# Patient Record
Sex: Male | Born: 1990 | Race: White | Hispanic: No | Marital: Single | Smoking: Current some day smoker
Health system: Southern US, Community
[De-identification: ages and names within clinical notes are randomized; demographics above are authoritative.]

---
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
## 2013-03-09 ENCOUNTER — Encounter (HOSPITAL_COMMUNITY): Payer: Self-pay | Admitting: Emergency Medicine

## 2013-03-09 ENCOUNTER — Emergency Department (HOSPITAL_COMMUNITY)
Admission: EM | Admit: 2013-03-09 | Discharge: 2013-03-09 | Discharge status: Against Medical Advice | Payer: Self-pay | Attending: Emergency Medicine | Admitting: Emergency Medicine

## 2013-03-09 DIAGNOSIS — M25559 Pain in unspecified hip: Secondary | ICD-10-CM | POA: Insufficient documentation

## 2013-03-09 DIAGNOSIS — F17200 Nicotine dependence, unspecified, uncomplicated: Secondary | ICD-10-CM | POA: Insufficient documentation

## 2013-03-09 NOTE — ED Notes (Signed)
No answer

## 2013-03-09 NOTE — ED Notes (Signed)
Rt hip pain x 1 week after doing yard work. Ambulated to triage.

## 2013-03-09 NOTE — ED Notes (Signed)
Called to room x1. Not in waiting room.

## 2015-08-22 ENCOUNTER — Emergency Department (HOSPITAL_COMMUNITY)
Admission: EM | Admit: 2015-08-22 | Discharge: 2015-08-22 | Disposition: A | Discharge status: Home / Self Care | Payer: Self-pay | Attending: Emergency Medicine | Admitting: Emergency Medicine

## 2015-08-22 ENCOUNTER — Encounter (HOSPITAL_COMMUNITY): Payer: Self-pay | Admitting: Emergency Medicine

## 2015-08-22 DIAGNOSIS — Y929 Unspecified place or not applicable: Secondary | ICD-10-CM | POA: Insufficient documentation

## 2015-08-22 DIAGNOSIS — S8011XA Contusion of right lower leg, initial encounter: Secondary | ICD-10-CM | POA: Insufficient documentation

## 2015-08-22 DIAGNOSIS — F17200 Nicotine dependence, unspecified, uncomplicated: Secondary | ICD-10-CM | POA: Insufficient documentation

## 2015-08-22 DIAGNOSIS — Y939 Activity, unspecified: Secondary | ICD-10-CM | POA: Insufficient documentation

## 2015-08-22 DIAGNOSIS — X58XXXA Exposure to other specified factors, initial encounter: Secondary | ICD-10-CM | POA: Insufficient documentation

## 2015-08-22 DIAGNOSIS — Y999 Unspecified external cause status: Secondary | ICD-10-CM | POA: Insufficient documentation

## 2015-08-22 LAB — CBC WITH DIFFERENTIAL/PLATELET
BASOS PCT: 1 %
Basophils Absolute: 0 10*3/uL (ref 0.0–0.1)
EOS PCT: 4 %
Eosinophils Absolute: 0.4 10*3/uL (ref 0.0–0.7)
HEMATOCRIT: 43.9 % (ref 39.0–52.0)
Hemoglobin: 14.9 g/dL (ref 13.0–17.0)
LYMPHS PCT: 31 %
Lymphs Abs: 2.5 10*3/uL (ref 0.7–4.0)
MCH: 30.4 pg (ref 26.0–34.0)
MCHC: 33.9 g/dL (ref 30.0–36.0)
MCV: 89.6 fL (ref 78.0–100.0)
MONO ABS: 0.5 10*3/uL (ref 0.1–1.0)
MONOS PCT: 6 %
NEUTROS ABS: 4.7 10*3/uL (ref 1.7–7.7)
Neutrophils Relative %: 58 %
PLATELETS: 219 10*3/uL (ref 150–400)
RBC: 4.9 MIL/uL (ref 4.22–5.81)
RDW: 12.6 % (ref 11.5–15.5)
WBC: 8.1 10*3/uL (ref 4.0–10.5)

## 2015-08-22 LAB — BASIC METABOLIC PANEL
Anion gap: 5 (ref 5–15)
BUN: 10 mg/dL (ref 6–20)
CALCIUM: 9.2 mg/dL (ref 8.9–10.3)
CO2: 27 mmol/L (ref 22–32)
CREATININE: 1.18 mg/dL (ref 0.61–1.24)
Chloride: 107 mmol/L (ref 101–111)
GFR calc Af Amer: >60 mL/min (ref >60)
GLUCOSE: 92 mg/dL (ref 65–99)
Potassium: 4.1 mmol/L (ref 3.5–5.1)
Sodium: 139 mmol/L (ref 135–145)

## 2015-08-22 MED ORDER — KETOROLAC TROMETHAMINE 10 MG PO TABS
10.0000 mg | ORAL_TABLET | Freq: Once | ORAL | Status: AC
  Administered 2015-08-22: 10 mg via ORAL
  Filled 2015-08-22: qty 1

## 2015-08-22 MED ORDER — PREDNISONE 20 MG PO TABS
40.0000 mg | ORAL_TABLET | Freq: Once | ORAL | Status: AC
  Administered 2015-08-22: 40 mg via ORAL
  Filled 2015-08-22: qty 2

## 2015-08-22 MED ORDER — DEXAMETHASONE 4 MG PO TABS: 4.0000 mg | ORAL_TABLET | Freq: Two times a day (BID) | ORAL | Status: AC

## 2015-08-22 MED ORDER — DICLOFENAC SODIUM 75 MG PO TBEC: 75.0000 mg | DELAYED_RELEASE_TABLET | Freq: Two times a day (BID) | ORAL | Status: AC

## 2015-08-22 NOTE — ED Provider Notes (Signed)
CSN: [id number]     Arrival date & time 08/22/15  2046 History   First MD Initiated Contact with Patient 08/22/15 2103     Chief Complaint  Patient presents with  . Leg Pain     (Consider location/radiation/quality/duration/timing/severity/associated sxs/prior Treatment) HPI Comments: Patient is a 25 year old male who presents to the emergency department with a complaint of pain and swelling of the right lower extremity.  The patient states that about a week ago he stepped through a porch and injured his right leg. He states he had a few scratches, but they healed up. He had mild soreness present but that mostly resolved. 2 days ago he began to notice some increasing swelling of his right leg near the right knee area. Today he notices even more swelling. He has some soreness particularly with certain movement. He has not had high fever. He has not noted red streaking in this area. He's not had a new injury to the area. He has no difficulty with movement of the right hip, ankle, or toes.  Patient is a 25 y.o. male presenting with leg pain. The history is provided by the patient.  Leg Pain Associated symptoms: no back pain and no neck pain     History reviewed. No pertinent past medical history. History reviewed. No pertinent past surgical history. No family history on file. Social History  Substance Use Topics  . Smoking status: Current Some Day Smoker  . Smokeless tobacco: None  . Alcohol Use: No    Review of Systems  Constitutional: Negative for activity change.       All ROS Neg except as noted in HPI  HENT: Negative for nosebleeds.   Eyes: Negative for photophobia and discharge.  Respiratory: Negative for cough, shortness of breath and wheezing.   Cardiovascular: Negative for chest pain and palpitations.  Gastrointestinal: Negative for abdominal pain and blood in stool.  Genitourinary: Negative for dysuria, frequency and hematuria.  Musculoskeletal: Negative for back pain,  arthralgias and neck pain.  Skin: Negative.   Neurological: Negative for dizziness, seizures and speech difficulty.  Psychiatric/Behavioral: Negative for hallucinations and confusion.      Allergies  Review of patient's allergies indicates no known allergies.  Home Medications   Prior to Admission medications   Not on File   BP 128/57 mmHg  Pulse 62  Temp(Src) 98.4 F (36.9 C) (Oral)  Resp 22  Ht  (1.854 m)  Wt 99.791 kg  BMI 29.03 kg/m2  SpO2 100% Physical Exam  Constitutional: He is oriented to person, place, and time. He appears well-developed and well-nourished.  Non-toxic appearance.  HENT:  Head: Normocephalic.  Right Ear: Tympanic membrane and external ear normal.  Left Ear: Tympanic membrane and external ear normal.  Eyes: EOM and lids are normal. Pupils are equal, round, and reactive to light.  Neck: Normal range of motion. Neck supple. Carotid bruit is not present.  Cardiovascular: Normal rate, regular rhythm, normal heart sounds, intact distal pulses and normal pulses.   Pulmonary/Chest: Breath sounds normal. No respiratory distress.  Abdominal: Soft. Bowel sounds are normal. There is no tenderness. There is no guarding.  Musculoskeletal: Normal range of motion.  There is full range of motion of the right hip. There is no deformity of the quadricep area. There is a raised area consistent with a hematoma at the lateral on knee area on the right on. There no red streaks appreciated. There is some increased warmth at this area. There is no swelling or  deformity involving the tibial area. There is a small bruise at the medial foot area. The dorsalis pedis pulses 2+. Capillary refill is less than 2 seconds. The Achilles tendon is intact.  Lymphadenopathy:       Head (right side): No submandibular adenopathy present.       Head (left side): No submandibular adenopathy present.    He has no cervical adenopathy.  Neurological: He is alert and oriented to person,  place, and time. He has normal strength. No cranial nerve deficit or sensory deficit.  Skin: Skin is warm and dry.  Psychiatric: He has a normal mood and affect. His speech is normal.  Nursing note and vitals reviewed.   ED Course  Procedures (including critical care time) Labs Review Labs Reviewed  CBC WITH DIFFERENTIAL/PLATELET  BASIC METABOLIC PANEL    Imaging Review No results found. I have personally reviewed and evaluated these images and lab results as part of my medical decision-making.   EKG Interpretation None      MDM  Vital signs within normal limits. The patient has a raised area of the lateral knee area of the right lower extremity. The complete blood count and basic metabolic panel has been obtained.   Complete blood count and metabolic panel are within normal limits. Patient fitted with a knee immobilizer. The patient will use diclofenac and Decadron over the next 5-7 days. He will see Dr. Romeo AppleHarrison for orthopedic evaluation if not improving after this time. Patient is in agreement with this discharge plan.    Final diagnoses:  Hematoma of lower extremity, right, initial encounter    *I have reviewed nursing notes, vital signs, and all appropriate lab and imaging results for this patient.[address]**    Threasa Kinch, PA-C 08/22/15 2324  Zadie Rhineonald Wickline, MD 08/22/15 [phone]

## 2015-08-22 NOTE — Discharge Instructions (Signed)
Your vital signs are within normal limits. Your blood work is also within normal limits. Your examination is consistent with a hematoma involving the lateral portion of your knee. Please use the knee immobilizer over the next 5 days. If swelling and tenderness are not resolving within the next 7 days, please see Dr. Romeo AppleHarrison for orthopedic evaluation. Use diclofenac and Decadron 2 times daily with food. You do not need to sleep in the knee immobilizer device, but should have it on when you're up and about.

## 2015-08-22 NOTE — ED Notes (Addendum)
Pt with R leg pain after injury. Pt stepped off a porch wrong about a week ago and now has significant sized knot to R Leg.

## 2015-09-01 ENCOUNTER — Emergency Department (HOSPITAL_COMMUNITY): Payer: Self-pay

## 2015-09-01 ENCOUNTER — Emergency Department (HOSPITAL_COMMUNITY)
Admission: EM | Admit: 2015-09-01 | Discharge: 2015-09-02 | Disposition: A | Discharge status: Home / Self Care | Payer: Self-pay | Attending: Emergency Medicine | Admitting: Emergency Medicine

## 2015-09-01 ENCOUNTER — Encounter (HOSPITAL_COMMUNITY): Payer: Self-pay | Admitting: Emergency Medicine

## 2015-09-01 DIAGNOSIS — T792XXA Traumatic secondary and recurrent hemorrhage and seroma, initial encounter: Secondary | ICD-10-CM

## 2015-09-01 DIAGNOSIS — S8011XA Contusion of right lower leg, initial encounter: Secondary | ICD-10-CM | POA: Insufficient documentation

## 2015-09-01 DIAGNOSIS — Y929 Unspecified place or not applicable: Secondary | ICD-10-CM | POA: Insufficient documentation

## 2015-09-01 DIAGNOSIS — F17200 Nicotine dependence, unspecified, uncomplicated: Secondary | ICD-10-CM | POA: Insufficient documentation

## 2015-09-01 DIAGNOSIS — W133XXA Fall through floor, initial encounter: Secondary | ICD-10-CM | POA: Insufficient documentation

## 2015-09-01 DIAGNOSIS — Y9389 Activity, other specified: Secondary | ICD-10-CM | POA: Insufficient documentation

## 2015-09-01 DIAGNOSIS — Y999 Unspecified external cause status: Secondary | ICD-10-CM | POA: Insufficient documentation

## 2015-09-01 NOTE — ED Notes (Signed)
Pt was here last week, got volarten and a steroid last week . Now out of meds . Pt has no md or insurance and has not followed up with anyone as directed. C/O pain 7 of 10 and states the knot on his leg has not gone down

## 2015-09-01 NOTE — ED Notes (Signed)
Pt taken to xray, amb with steady gait

## 2015-09-01 NOTE — ED Notes (Signed)
Pt c/o continued rt leg pain with swelling. Pt was seen for the same last week.

## 2015-09-02 MED ORDER — TRAMADOL HCL 50 MG PO TABS: 50.0000 mg | ORAL_TABLET | Freq: Four times a day (QID) | ORAL | Status: AC | PRN

## 2015-09-02 NOTE — ED Provider Notes (Signed)
CSN: [id number]     Arrival date & time 09/01/15  2159 History   First MD Initiated Contact with Patient 09/01/15 2213     Chief Complaint  Patient presents with  . Leg Pain     (Consider location/radiation/quality/duration/timing/severity/associated sxs/prior Treatment) HPI   Lee Shields is a 25 y.o. male who presents to the Emergency Department complaining of persistent "knot" to his right lower leg.  He states that he was seen here on 08/22/15 after his lower leg fell through a wooden floor.  He reports bruising to the area and a "knot" that developed few days later.  He was seen here and treated with steroid and NSAID without relief.  He was given a knee immobilizer which has not provided relief.  He denies numbness, increased swelling or open wounds to the leg.    History reviewed. No pertinent past medical history. History reviewed. No pertinent past surgical history. History reviewed. No pertinent family history. Social History  Substance Use Topics  . Smoking status: Current Some Day Smoker  . Smokeless tobacco: None  . Alcohol Use: No    Review of Systems  Constitutional: Negative for fever and chills.  Musculoskeletal: Positive for myalgias (swollen mass to right lower leg).  Skin: Negative for color change and wound.  All other systems reviewed and are negative.     Allergies  Review of patient's allergies indicates no known allergies.  Home Medications   Prior to Admission medications   Medication Sig Start Date End Date Taking? Authorizing Provider  dexamethasone (DECADRON) 4 MG tablet Take 1 tablet (4 mg total) by mouth 2 (two) times daily with a meal. 08/22/15   Ivery Quale, PA-C  diclofenac (VOLTAREN) 75 MG EC tablet Take 1 tablet (75 mg total) by mouth 2 (two) times daily. 08/22/15   Ivery Quale, PA-C  traMADol (ULTRAM) 50 MG tablet Take 1 tablet (50 mg total) by mouth every 6 (six) hours as needed. 09/02/15   Alonza Knisley, PA-C   BP 138/82 mmHg   Pulse 68  Temp(Src) 98.4 F (36.9 C) (Oral)  Resp 18  Ht  (1.854 m)  Wt 99.791 kg  BMI 29.03 kg/m2  SpO2 99% Physical Exam  Constitutional: He is oriented to person, place, and time. He appears well-developed and well-nourished. No distress.  HENT:  Head: Normocephalic and atraumatic.  Cardiovascular: Normal rate and regular rhythm.   Pulmonary/Chest: Effort normal and breath sounds normal. No respiratory distress.  Musculoskeletal:       Legs: 4x6 cm mobile, flesh colored mass to the medial right lower leg.  No erythema, induration, or bruising.  knee joint NT, no posterior tenderness  Neurological: He is alert and oriented to person, place, and time. He exhibits normal muscle tone. Coordination normal.  Skin: Skin is warm and dry. No erythema.  Nursing note and vitals reviewed.   ED Course  Procedures (including critical care time) Labs Review Labs Reviewed - No data to display  Imaging Review Dg Tibia/fibula Right  09/02/2015  CLINICAL DATA:  Injury 2 weeks ago with laceration. Tender swelling. Subsequent encounter. EXAM: RIGHT TIBIA AND FIBULA - 2 VIEW COMPARISON:  None. FINDINGS: Swelling with subcutaneous opacity in the medial upper calf. No opaque foreign body or emphysema. No osseous abnormality. IMPRESSION: Medial upper calf swelling without opaque foreign body or soft tissue emphysema. Electronically Signed   By: Marnee Spring [id number] On: 09/02/2015 00:18   I have personally reviewed and evaluated these images and lab  results as part of my medical decision-making.   EKG Interpretation None      MDM   Final diagnoses:  Seroma due to trauma    Localized, 4x 6 cm flesh colored, mobile mass to the medial aspect of right lower leg secondary to trauma.  doubt abscess, likely seroma vs hematoma. Compartments soft, NV intact   I spoke with Dr. Grace IsaacWatts regarding further imaging with ultrasound and he did not feel that it would be beneficial to provide further  detail.    I have applied ACE wrap.  Advised not to wear continously.  Pt agrees to care plan.  Appears stable for d/c   Pauline Ausammy Starasia Sinko, PA-C 09/02/15 0118  Samuel JesterKathleen McManus, DO 09/03/15 2159

## 2017-09-15 IMAGING — DX DG TIBIA/FIBULA 2V*R*
4 series · 4 of 4 positions shown · non-contrast
Comparison: None.

CLINICAL DATA: Injury 2 weeks ago with laceration. Tender swelling.
Subsequent encounter.

EXAM:
RIGHT TIBIA AND FIBULA - 2 VIEW

[tibia ap (1 of 2)]
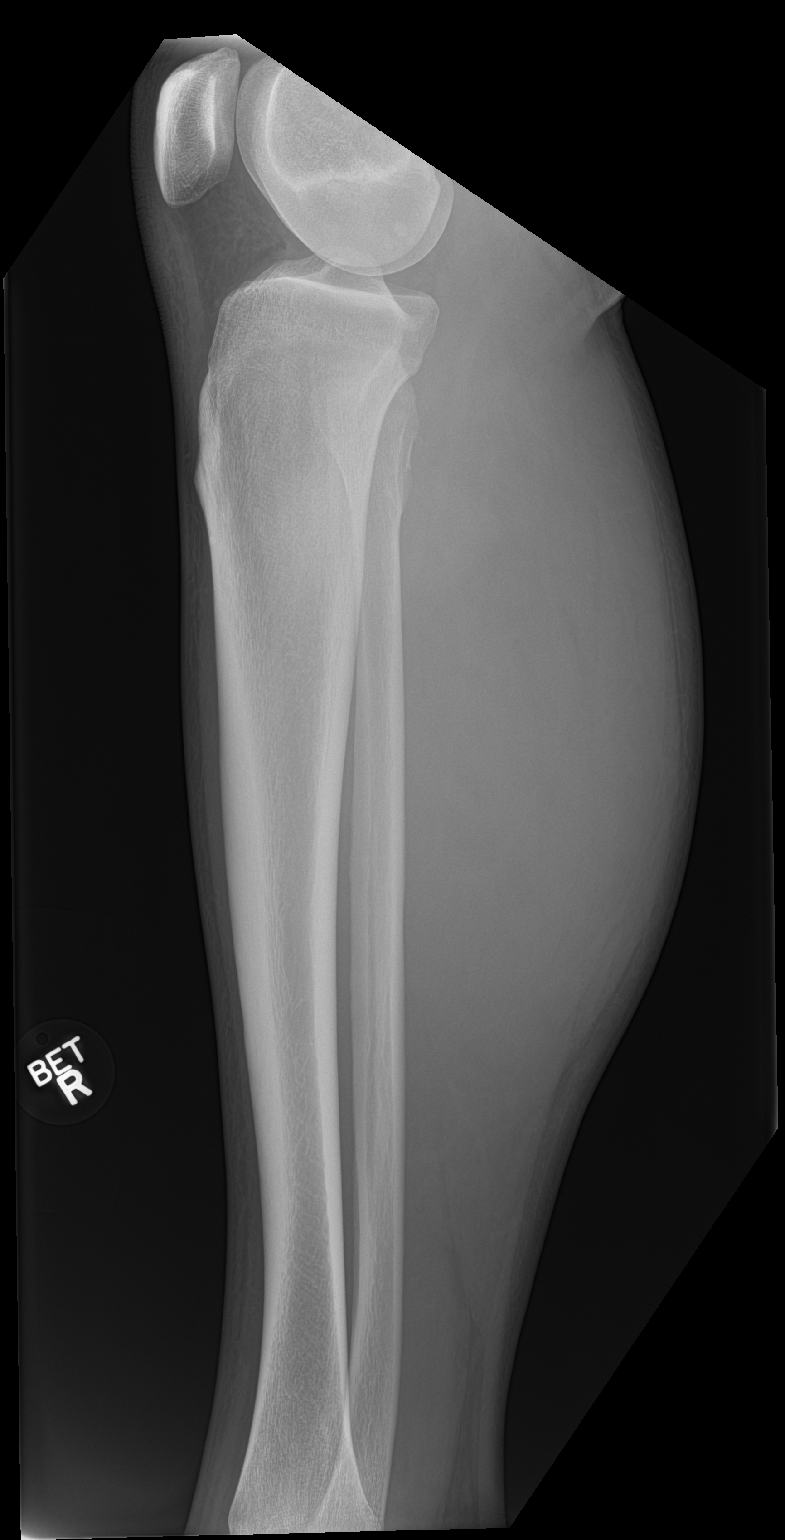

[tibia ap (2 of 2)]
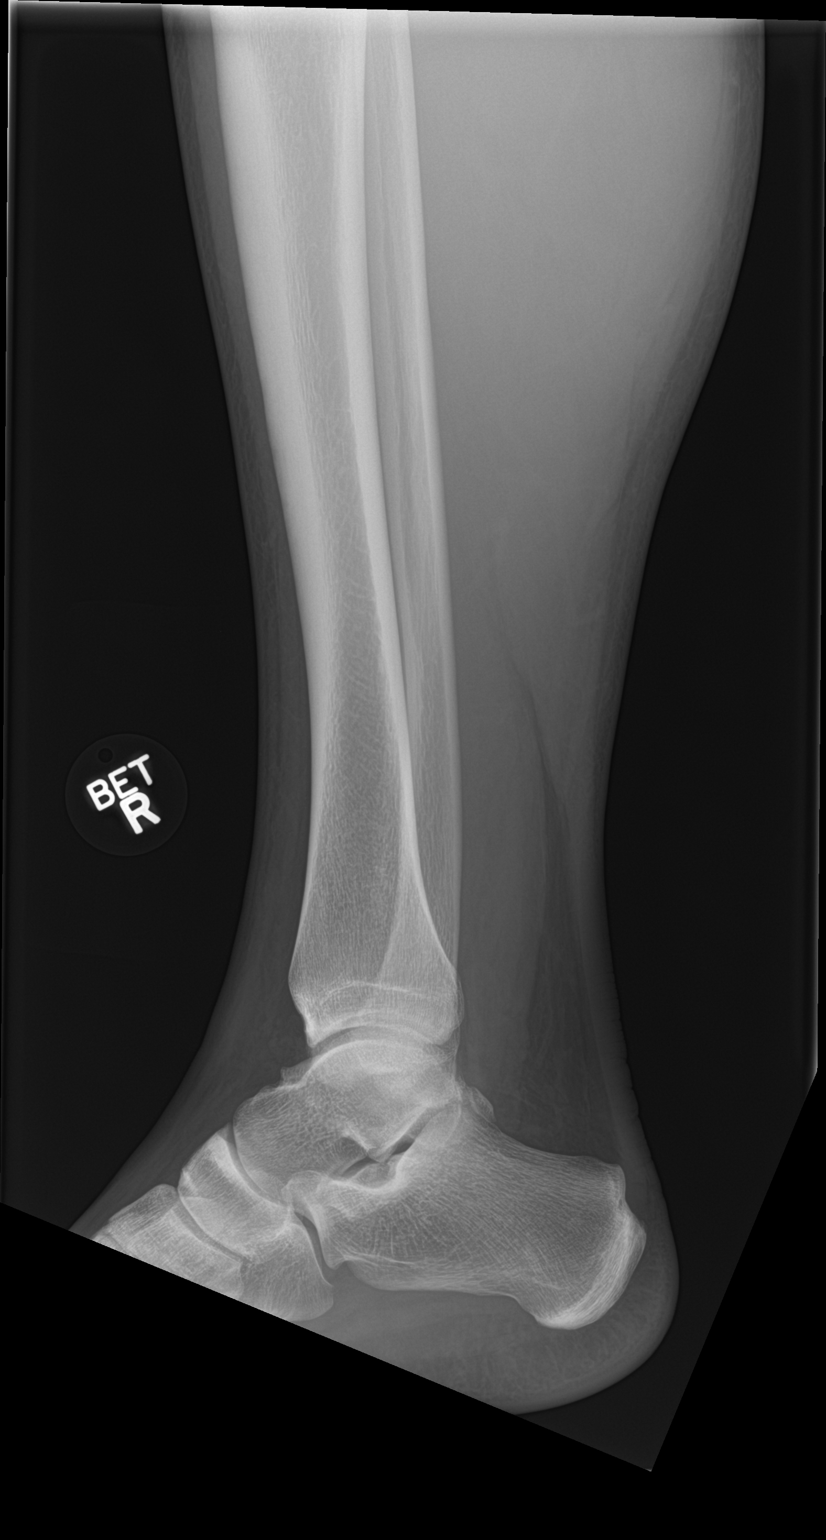

[tibia lat (1 of 2)]
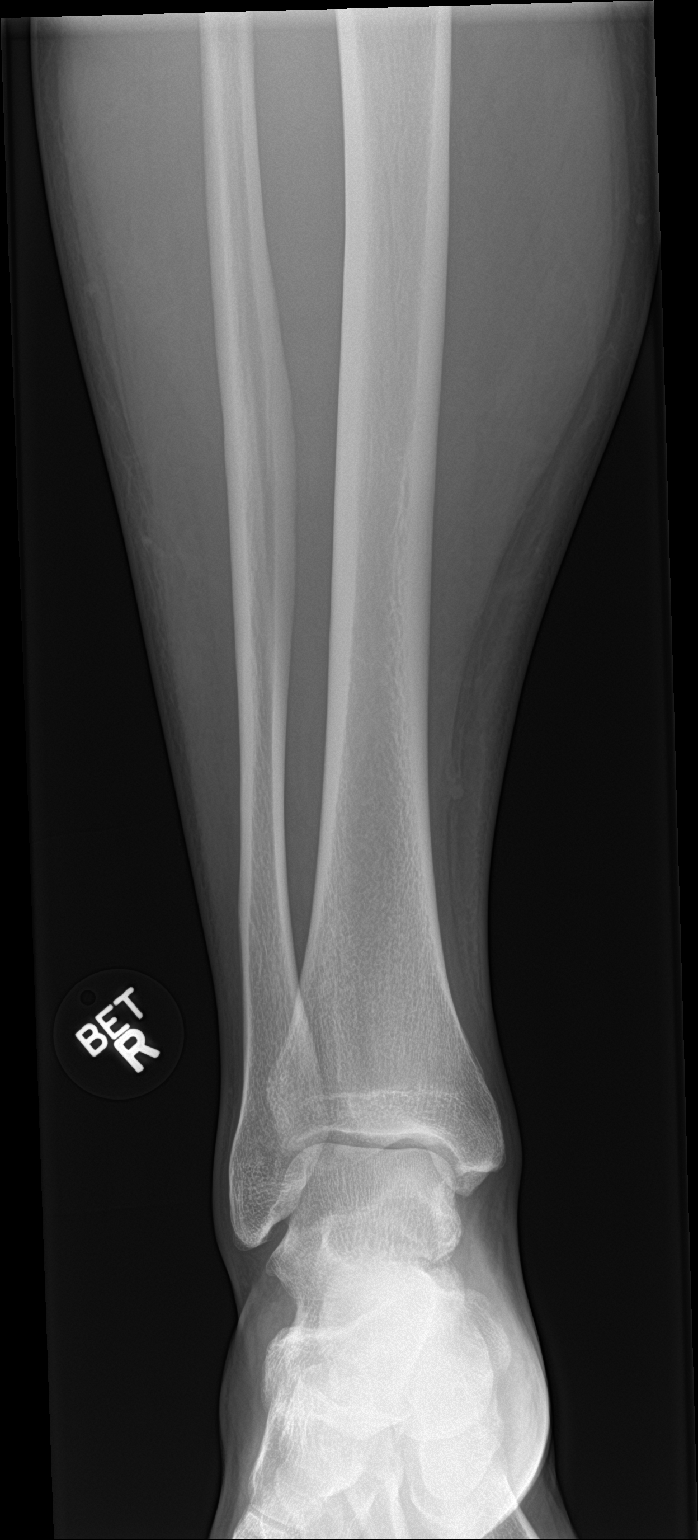

[tibia lat (2 of 2)]
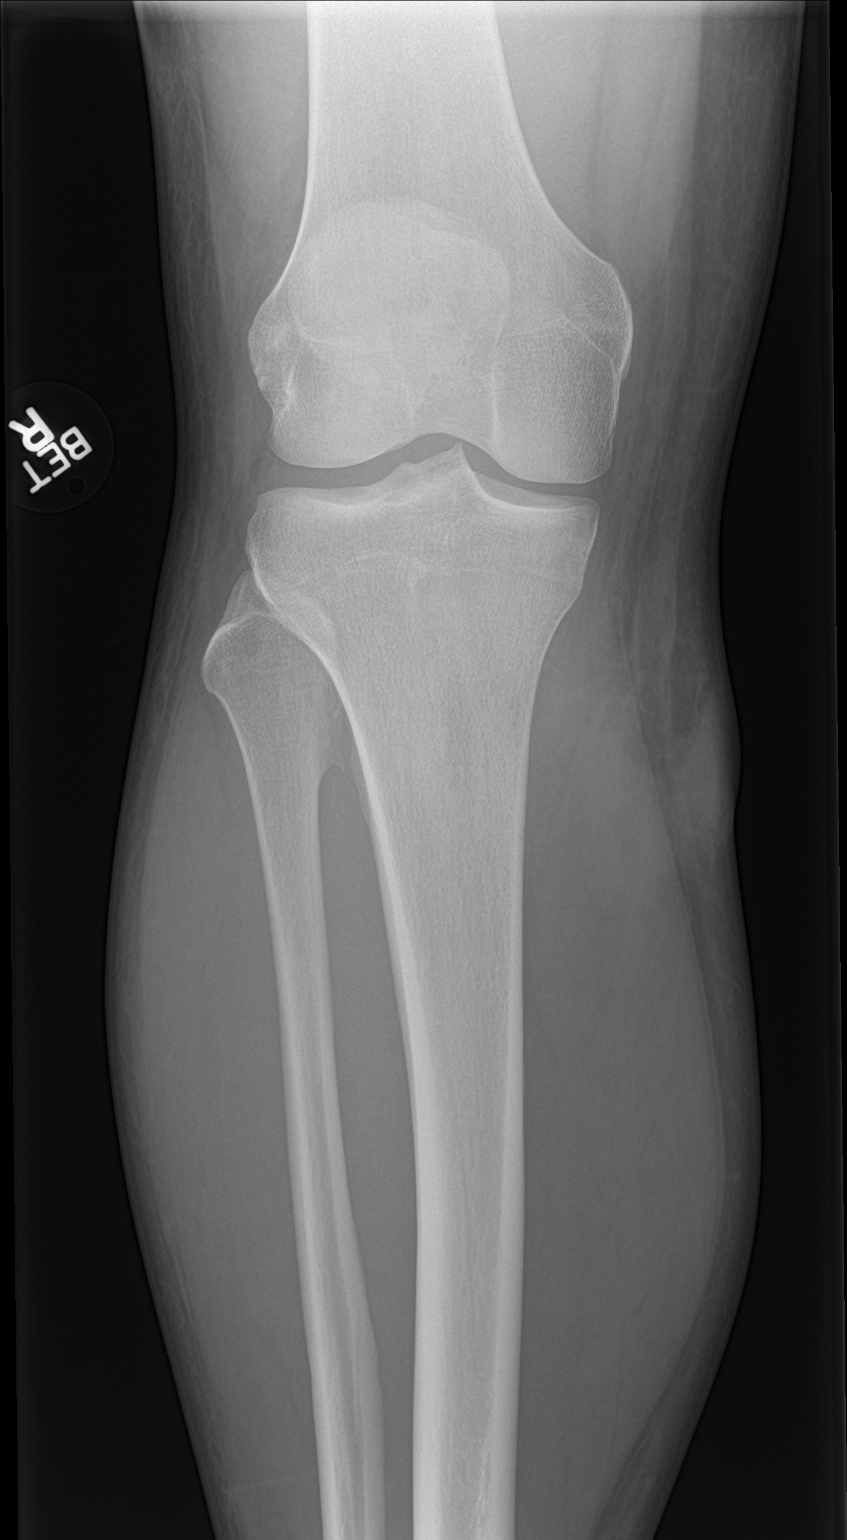

[4 of 4 positions shown; findings below may reference images not displayed]

FINDINGS: Swelling with subcutaneous opacity in the medial upper calf. No
opaque foreign body or emphysema. No osseous abnormality.
IMPRESSION: Medial upper calf swelling without opaque foreign body or soft
tissue emphysema.
